# Patient Record
Sex: Male | Born: 1980 | Race: White | Hispanic: No | Marital: Single | Smoking: Former smoker
Health system: Southern US, Community
[De-identification: ages and names within clinical notes are randomized; demographics above are authoritative.]

## PROBLEM LIST (undated history)

## (undated) DIAGNOSIS — M25519 Pain in unspecified shoulder: Secondary | ICD-10-CM

## (undated) DIAGNOSIS — M549 Dorsalgia, unspecified: Secondary | ICD-10-CM

## (undated) DIAGNOSIS — G4730 Sleep apnea, unspecified: Secondary | ICD-10-CM

## (undated) HISTORY — DX: Sleep apnea, unspecified: G47.30

## (undated) HISTORY — DX: Pain in unspecified shoulder: M25.519

## (undated) HISTORY — DX: Dorsalgia, unspecified: M54.9

## (undated) HISTORY — PX: NO PAST SURGERIES: SHX2092

---

## 2011-11-27 ENCOUNTER — Ambulatory Visit (INDEPENDENT_AMBULATORY_CARE_PROVIDER_SITE_OTHER): Payer: BC Managed Care – PPO | Admitting: Family Medicine

## 2011-11-27 VITALS — BP 146/86 | HR 64 | Temp 98.7°F | Resp 16 | Ht 69.0 in | Wt 233.6 lb

## 2011-11-27 DIAGNOSIS — M549 Dorsalgia, unspecified: Secondary | ICD-10-CM

## 2011-11-27 MED ORDER — HYDROCODONE-ACETAMINOPHEN 5-500 MG PO TABS: 1.0000 | ORAL_TABLET | Freq: Every day | ORAL | Status: AC | PRN

## 2011-11-27 NOTE — Progress Notes (Signed)
  Subjective:    Patient ID: Kevin Velazquez, male    DOB: September 24, 1980, 31 y.o.   MRN: [id number]  HPI 31 yo male with complaints of back/neck/shoulder pain.  Started over a year ago.  Sometimes feels "cracking" in side of neck when he rubs it.  Pain often in left shoulder blade.  Occasional aware of "different feeling" in arm and hands.  Subjectively feels weak but he has no trouble lifting, carrying, or doing fine motor tasks.   Seen for same 05/08/11.  Treated with flexeril and naproxen.  Advised to use heat and massage.  Also advised if not better in 10 days, to call or return as we would consider MRI.  States meds did not help at all.  Nothing has changed.    Worried he has nerve damage or muscle damage.  Something that can't be fixed.    Review of Systems Negative except as per HPI     Objective:   Physical Exam  Constitutional: He appears well-developed and well-nourished.  Cardiovascular: Normal rate, regular rhythm, normal heart sounds and intact distal pulses.   No murmur heard. Pulmonary/Chest: Effort normal and breath sounds normal.  Neurological: He is alert. He has normal strength. He displays no atrophy and no tremor. He exhibits normal muscle tone. Gait normal.  Skin: Skin is warm and dry.          Assessment & Plan:  Pain, paresthesias - discussed could do MRI neck but patient worried about cost.  Feel PT more likely to be useful/helpful.  ARrange for PT referral.  In meantime, given #10 Vicodin 5 for only bad days.  No refill for at least a month.   >30 minutes spent in consultation and discussion

## 2013-07-28 ENCOUNTER — Ambulatory Visit (INDEPENDENT_AMBULATORY_CARE_PROVIDER_SITE_OTHER): Payer: BC Managed Care – PPO | Admitting: Family Medicine

## 2013-07-28 VITALS — BP 144/86 | HR 77 | Temp 97.9°F | Resp 16 | Ht 70.0 in | Wt 234.6 lb

## 2013-07-28 DIAGNOSIS — N50811 Right testicular pain: Secondary | ICD-10-CM

## 2013-07-28 DIAGNOSIS — N451 Epididymitis: Secondary | ICD-10-CM

## 2013-07-28 DIAGNOSIS — M549 Dorsalgia, unspecified: Secondary | ICD-10-CM

## 2013-07-28 LAB — POCT URINALYSIS DIPSTICK
Bilirubin, UA: NEGATIVE
Glucose, UA: NEGATIVE
Leukocytes, UA: NEGATIVE
Nitrite, UA: NEGATIVE

## 2013-07-28 MED ORDER — CIPROFLOXACIN HCL 500 MG PO TABS: 500.0000 mg | ORAL_TABLET | Freq: Two times a day (BID) | ORAL | Status: DC

## 2013-07-28 NOTE — Patient Instructions (Signed)
Start antibiotic for epididymitis infection. Return to the clinic or go to the nearest emergency room if any of your symptoms worsen or new symptoms occur, or if not improving in next week.  If still some soreness in area after antibiotic - return for recheck.  Epididymitis Epididymitis is a swelling (inflammation) of the epididymis. The epididymis is a cord-like structure along the back part of the testicle. Epididymitis is usually, but not always, caused by infection. This is usually a sudden problem beginning with chills, fever and pain behind the scrotum and in the testicle. There may be swelling and redness of the testicle. DIAGNOSIS  Physical examination will reveal a tender, swollen epididymis. Sometimes, cultures are obtained from the urine or from prostate secretions to help find out if there is an infection or if the cause is a different problem. Sometimes, blood work is performed to see if your white blood cell count is elevated and if a germ (bacterial) or viral infection is present. Using this knowledge, an appropriate medicine which kills germs (antibiotic) can be chosen by your caregiver. A viral infection causing epididymitis will most often go away (resolve) without treatment. HOME CARE INSTRUCTIONS   Hot sitz baths for 20 minutes, 4 times per day, may help relieve pain.  Only take over-the-counter or prescription medicines for pain, discomfort or fever as directed by your caregiver.  Take all medicines, including antibiotics, as directed. Take the antibiotics for the full prescribed length of time even if you are feeling better.  It is very important to keep all follow-up appointments. SEEK IMMEDIATE MEDICAL CARE IF:   You have a fever.  You have pain not relieved with medicines.  You have any worsening of your problems.  Your pain seems to come and go.  You develop pain, redness, and swelling in the scrotum and surrounding areas. MAKE SURE YOU:   Understand these  instructions.  Will watch your condition.  Will get help right away if you are not doing well or get worse. Document Released: 08/01/2000 Document Revised: 10/27/2011 Document Reviewed: 06/21/2009 [facility] Patient Information 2014 [place], [place].

## 2013-07-28 NOTE — Progress Notes (Signed)
Subjective:    Patient ID: Kevin Velazquez, male    DOB: 11/14/1980, 32 y.o.   MRN: [id number] This chart was scribed for Meredith Staggers, MD by Danella Maiers, ED Scribe. This patient was seen in room 1 and the patient's care was started at 8:54 PM.  Chief Complaint  Patient presents with  . Testicle Pain    x 1 week     HPI HPI Comments: Kevin Velazquez is a 32 y.o. male who presents to the [facility] complaining of constant, unchanged right testicular pain that he woke up with 5 days ago. He states the pain is worsened by sitting, better with standing. He states it feels like he was kicked although he was not. He reports associated right leg pain inside of the leg and groin pain. He has not tried any OTC medications. He states 2 weeks before the onset of symptoms he flicked the right testicule, had soreness for thirty minutes, but no soreness or swelling until this recent episode. He denies h/o similar pain. He denies penile discharge, dysuria, frequency, hematuria, fevers, back pain.  He has not been sexually active in 2-3 years. He denies h/o STDs.   PCP - No primary provider on file.  There are no active problems to display for this patient.  No past medical history on file. No past surgical history on file. No Known Allergies Prior to Admission medications   Not on File   History  Substance Use Topics  . Smoking status: Former Smoker    Quit date: 11/27/2010  . Smokeless tobacco: Not on file  . Alcohol Use: No    Review of Systems  Genitourinary: Positive for testicular pain. Negative for dysuria, frequency, hematuria, discharge, penile swelling, scrotal swelling, difficulty urinating and penile pain.       Objective:   Physical Exam  Nursing note and vitals reviewed. Constitutional: He is oriented to person, place, and time. He appears well-developed and well-nourished. No distress.  HENT:  Head: Normocephalic and atraumatic.  Eyes: EOM are normal.    Neck: Neck supple. No tracheal deviation present.  Cardiovascular: Normal rate.   Pulmonary/Chest: Effort normal. No respiratory distress.  Abdominal: Hernia confirmed negative in the right inguinal area.  Genitourinary: Penis normal. Right testis shows tenderness (tender to palpation at right epididymis slightly tender to distal inguinal canal without palpable hernia. No medial thigh or groin erythema noted. No wounds.). Right testis shows no mass and no swelling. No penile erythema or penile tenderness. No discharge found.  Musculoskeletal: Normal range of motion.  Lymphadenopathy:       Right: No inguinal adenopathy present.  Neurological: He is alert and oriented to person, place, and time.  Skin: Skin is warm and dry. No rash noted.  Psychiatric: He has a normal mood and affect. His behavior is normal.     Filed Vitals:   07/28/13 2039  BP: 144/86  Pulse: 77  Temp: 97.9 F (36.6 C)  TempSrc: Oral  Resp: 16  Height: 5\' 10"  (1.778 m)  Weight: 234 lb 9.6 oz (106.414 kg)  SpO2: 98%   Results for orders placed in visit on 07/28/13  POCT URINALYSIS DIPSTICK      Result Value Range   Color, UA yellow     Clarity, UA clear     Glucose, UA neg     Bilirubin, UA neg     Ketones, UA neg     Spec Grav, UA 1.025  Blood, UA neg     pH, UA 6.0     Protein, UA neg     Urobilinogen, UA 0.2     Nitrite, UA neg     Leukocytes, UA Negative         Assessment & Plan:   Kevin Velazquez is a 32 y.o. male Right testicular pain - Plan: POCT urinalysis dipstick, GC/chlamydia probe amp, genital, ciprofloxacin (CIPRO) 500 MG tablet  Epididymitis, right - Plan: POCT urinalysis dipstick, GC/chlamydia probe amp, genital, ciprofloxacin (CIPRO) 500 MG tablet  Early R sided epididymitis. No hernia palpated, not sexually active in past 2 years, doubt STI cause, but will check genprobe.  Start cipro - 500mg  BID, 10 days.  sx care as below.  RTC precautions discussed.   Meds ordered this  encounter  Medications  . ciprofloxacin (CIPRO) 500 MG tablet    Sig: Take 1 tablet (500 mg total) by mouth 2 (two) times daily.    Dispense:  20 tablet    Refill:  0   Patient Instructions  Start antibiotic for epididymitis infection. Return to the clinic or go to the nearest emergency room if any of your symptoms worsen or new symptoms occur, or if not improving in next week.  If still some soreness in area after antibiotic - return for recheck.  Epididymitis Epididymitis is a swelling (inflammation) of the epididymis. The epididymis is a cord-like structure along the back part of the testicle. Epididymitis is usually, but not always, caused by infection. This is usually a sudden problem beginning with chills, fever and pain behind the scrotum and in the testicle. There may be swelling and redness of the testicle. DIAGNOSIS  Physical examination will reveal a tender, swollen epididymis. Sometimes, cultures are obtained from the urine or from prostate secretions to help find out if there is an infection or if the cause is a different problem. Sometimes, blood work is performed to see if your white blood cell count is elevated and if a germ (bacterial) or viral infection is present. Using this knowledge, an appropriate medicine which kills germs (antibiotic) can be chosen by your caregiver. A viral infection causing epididymitis will most often go away (resolve) without treatment. HOME CARE INSTRUCTIONS   Hot sitz baths for 20 minutes, 4 times per day, may help relieve pain.  Only take over-the-counter or prescription medicines for pain, discomfort or fever as directed by your caregiver.  Take all medicines, including antibiotics, as directed. Take the antibiotics for the full prescribed length of time even if you are feeling better.  It is very important to keep all follow-up appointments. SEEK IMMEDIATE MEDICAL CARE IF:   You have a fever.  You have pain not relieved with  medicines.  You have any worsening of your problems.  Your pain seems to come and go.  You develop pain, redness, and swelling in the scrotum and surrounding areas. MAKE SURE YOU:   Understand these instructions.  Will watch your condition.  Will get help right away if you are not doing well or get worse. Document Released: 08/01/2000 Document Revised: 10/27/2011 Document Reviewed: 06/21/2009 [facility] Patient Information 2014 [place], [place].      I personally performed the services described in this documentation, which was scribed in my presence. The recorded information has been reviewed and considered, and addended by me as needed.

## 2013-07-30 LAB — GC/CHLAMYDIA PROBE AMP
CT Probe RNA: NEGATIVE
GC Probe RNA: NEGATIVE

## 2013-08-08 ENCOUNTER — Telehealth: Payer: Self-pay

## 2013-08-08 NOTE — Telephone Encounter (Signed)
Patient called, stated there is no improvement from visit.  He will return to clinic today for recheck.

## 2013-08-17 ENCOUNTER — Ambulatory Visit (INDEPENDENT_AMBULATORY_CARE_PROVIDER_SITE_OTHER): Payer: BC Managed Care – PPO | Admitting: Family Medicine

## 2013-08-17 VITALS — BP 136/78 | HR 58 | Temp 98.8°F | Resp 16 | Ht 69.5 in | Wt 230.2 lb

## 2013-08-17 DIAGNOSIS — N50819 Testicular pain, unspecified: Secondary | ICD-10-CM

## 2013-08-17 DIAGNOSIS — K6289 Other specified diseases of anus and rectum: Secondary | ICD-10-CM

## 2013-08-17 DIAGNOSIS — N509 Disorder of male genital organs, unspecified: Secondary | ICD-10-CM

## 2013-08-17 MED ORDER — DOXYCYCLINE HYCLATE 100 MG PO CAPS: 100.0000 mg | ORAL_CAPSULE | Freq: Two times a day (BID) | ORAL | Status: DC

## 2013-08-17 MED ORDER — HYDROCORTISONE 2.5 % RE CREA: 1.0000 "application " | TOPICAL_CREAM | Freq: Two times a day (BID) | RECTAL | Status: DC

## 2013-08-17 NOTE — Progress Notes (Signed)
Subjective: Patient is still having problems. Please refer back to Dr. Earlean Polka. He did little bit better he thought for a few days after the Cipro but then resumed hurting. He hurts in both testicles, what sometimes one-sided count, sometimes the other. He doesn't feel any abnormality in the scrotum. He also has some problems with redness and irritation around his but he gets a burning discomfort there.  Objective: Pleasant gentleman in no major distress. Normal male external genitalia. No testicular masses were detected. No hernias. He is just a little bit tender along the epididymis, but really insignificant. His anus appears a little bit ran out a couple skinfolds. He has old hemorrhoidal tag. Digital exam was unremarkable.  Assessment: Testicular and scrotal pain, persistent Anal irritation  Plan: Analpram-HC cream 2.5% Doxycycline 100 mg twice a day Referral to a urologist

## 2013-08-17 NOTE — Patient Instructions (Signed)
Use the rectal cream twice daily  Doxycycline one twice daily  We will set up a referral to a urologist for you

## 2013-08-17 NOTE — Progress Notes (Signed)
   Subjective:    Patient ID: Kevin Velazquez, male    DOB: [birth year], 32 y.o.   MRN: [id number] close HPI    Review of Systems     Objective:   Physical Exam        Assessment & Plan:  close

## 2013-09-07 ENCOUNTER — Emergency Department (HOSPITAL_COMMUNITY): Payer: BC Managed Care – PPO

## 2013-09-07 ENCOUNTER — Emergency Department (HOSPITAL_COMMUNITY)
Admission: EM | Admit: 2013-09-07 | Discharge: 2013-09-07 | Disposition: A | Discharge status: Home / Self Care | Payer: BC Managed Care – PPO | Attending: Emergency Medicine | Admitting: Emergency Medicine

## 2013-09-07 ENCOUNTER — Encounter (HOSPITAL_COMMUNITY): Payer: Self-pay | Admitting: Emergency Medicine

## 2013-09-07 DIAGNOSIS — N50819 Testicular pain, unspecified: Secondary | ICD-10-CM

## 2013-09-07 DIAGNOSIS — F1210 Cannabis abuse, uncomplicated: Secondary | ICD-10-CM | POA: Insufficient documentation

## 2013-09-07 DIAGNOSIS — R0602 Shortness of breath: Secondary | ICD-10-CM

## 2013-09-07 DIAGNOSIS — F1290 Cannabis use, unspecified, uncomplicated: Secondary | ICD-10-CM

## 2013-09-07 DIAGNOSIS — R109 Unspecified abdominal pain: Secondary | ICD-10-CM | POA: Insufficient documentation

## 2013-09-07 DIAGNOSIS — F17200 Nicotine dependence, unspecified, uncomplicated: Secondary | ICD-10-CM | POA: Insufficient documentation

## 2013-09-07 DIAGNOSIS — R079 Chest pain, unspecified: Secondary | ICD-10-CM | POA: Insufficient documentation

## 2013-09-07 DIAGNOSIS — N509 Disorder of male genital organs, unspecified: Secondary | ICD-10-CM | POA: Insufficient documentation

## 2013-09-07 MED ORDER — TRAMADOL HCL 50 MG PO TABS: 50.0000 mg | ORAL_TABLET | Freq: Four times a day (QID) | ORAL | Status: DC | PRN

## 2013-09-07 NOTE — Progress Notes (Signed)
   CARE MANAGEMENT ED NOTE 09/07/2013  Patient:  Kevin Velazquez,Kevin Velazquez   Account Number:  [id number]  Date Initiated:  09/07/2013  Documentation initiated by:  Radford PaxFERRERO,Dicky Boer  Subjective/Objective Assessment:   Patient presents to Ed with difficulty breathing     Subjective/Objective Assessment Detail:   Chest xray completed and negative.     Action/Plan:   Action/Plan Detail:   Anticipated DC Date:       Status Recommendation to Physician:   Result of Recommendation:    Other ED Services  Consult Working Plan    DC Planning Services  Other  PCP issues    Choice offered to / List presented to:            Status of service:  Completed, signed off  ED Comments:   ED Comments Detail:  Patient confirms he does  not have a pcp but does have [employer] insurance.  EDCM instructed patient to call the phone number on the back of his insurance card or go to insurance company website to help him find a pcp who is close to him and within network.  Patient verbalized understanding.

## 2013-09-07 NOTE — ED Notes (Signed)
Pt also reports that he is having genital pain and has a follow up appointment with a urologist

## 2013-09-07 NOTE — ED Notes (Signed)
Pt complain of having difficulty breathing; pt states that he feels like he cannot get a good deep breathe; pt c/o bilateral rib pain; pt denies congestion or hx of asthma; pt states that he was a smoker and smoke occasionally and also had smoked marijuania this evening and difficulty breathing has gotten worse after smoking; pt states that he feels anxious due to the difficulty breathing

## 2013-09-07 NOTE — ED Provider Notes (Signed)
CSN: [id number]     Arrival date & time 09/07/13  2035 History   First MD Initiated Contact with Patient 09/07/13 2139     Chief Complaint  Patient presents with  . Respiratory Distress   (Consider location/radiation/quality/duration/timing/severity/associated sxs/prior Treatment) HPI Pt is a 33yo male c/o dyspnea earlier today, reports it felt like it panic attack. States symptoms started after he noticed an "odd sensation" in his stomach after he masturbated. States it felt like his stomach went numb. Pt also c/o centralized and left sided chest pain that is aching and sore but states that has been intermittent for 3 years and is not new for him. Reports having left sided "nerve problems" due to a "knot" in his back where he "carries all his stress." Admits to not sleeping well last night and smoking marijuanna earlier today. SOB started after smoking, but pt states he has smoked for 12 years and has never felt SOB. Does report hx of 1 other panic attack.  Pt also states he has had intermittent testicular discomfort for 2 months and has scheduled f/u with urology for Feb 6th. States this has been on his mind a lot lately and believes it also contributed to his anxiety and SOB earlier today. Denies recent illness. Denies fever. Denies n/v/d. Denies hx of asthma.  History reviewed. No pertinent past medical history. No past surgical history on file. Family History  Problem Relation Age of Onset  . Cancer Mother   . Stroke Maternal Grandmother   . Mental illness Maternal Grandmother   . Cancer Maternal Grandfather    History  Substance Use Topics  . Smoking status: Current Some Day Smoker    Types: Cigarettes    Last Attempt to Quit: 11/27/2010  . Smokeless tobacco: Not on file  . Alcohol Use: No    Review of Systems  Constitutional: Negative for fever and chills.  Respiratory: Positive for shortness of breath.   Cardiovascular: Positive for chest pain.  Gastrointestinal: Positive for  abdominal pain ( "numbness"). Negative for nausea and vomiting.  All other systems reviewed and are negative.    Allergies  Review of patient's allergies indicates no known allergies.  Home Medications   Current Outpatient Rx  Name  Route  Sig  Dispense  Refill  . ibuprofen (ADVIL,MOTRIN) 200 MG tablet   Oral   Take 600 mg by mouth every 6 (six) hours as needed for moderate pain.          . traMADol (ULTRAM) 50 MG tablet   Oral   Take 1 tablet (50 mg total) by mouth every 6 (six) hours as needed.   15 tablet   0    BP 159/85  Pulse 110  Temp(Src) 98.7 F (37.1 C) (Oral)  Resp 20  Ht 5\' 9"  (1.753 m)  Wt 223 lb (101.152 kg)  BMI 32.92 kg/m2  SpO2 97% Physical Exam  Nursing note and vitals reviewed. Constitutional: He appears well-developed and well-nourished.  Pt lying comfortably on exam bed, NAD. Watching television.  HENT:  Head: Normocephalic and atraumatic.  Eyes: Conjunctivae are normal. No scleral icterus.  Neck: Normal range of motion.  Cardiovascular: Normal rate, regular rhythm and normal heart sounds.   Pulmonary/Chest: Effort normal and breath sounds normal. No respiratory distress. He has no wheezes. He has no rales. He exhibits no tenderness.  No respiratory distress, able to speak in full sentences w/o difficulty. Lungs: CTAB  Abdominal: Soft. Bowel sounds are normal. He exhibits no distension and  no mass. There is no tenderness. There is no rebound and no guarding.  Musculoskeletal: Normal range of motion.  Neurological: He is alert.  Skin: Skin is warm and dry.    ED Course  Procedures (including critical care time) Labs Review Labs Reviewed - No data to display Imaging Review Dg Chest 2 View  09/07/2013   CLINICAL DATA:  Shortness of breath.  EXAM: CHEST  2 VIEW  COMPARISON:  None.  FINDINGS: The lungs are well-aerated and clear. There is no evidence of focal opacification, pleural effusion or pneumothorax.  The heart is normal in size; the  mediastinal contour is within normal limits. No acute osseous abnormalities are seen.  IMPRESSION: No acute cardiopulmonary process seen.   Electronically Signed   By: Roanna Raider M.D.   On: 09/07/2013 22:26    EKG Interpretation   None       MDM   1. SOB (shortness of breath)   2. Marijuana use   3. Abdominal pain   4. Chest pain   5. Testicular discomfort    Pt c/o SOB after smoking marijuana earlier today. Reports thinking it was due to anxiety like a panic attack as pt has had a panic attack in past. Admits to lack of sleep and feeling anxious about "odd" sensation in his stomach along with testicular discomfort. Reports chest pain but states it has been present for 3 years.  States symptoms have resolved since being in ED. Vitals: unremarkable.  CXR: unremarkable.    Not concerned for ACS or PE.  Do not believe further workup needed at this time.   CXR discussed with patient. All questions answered and concerns addressed. Will discharge pt home and have pt f/u with [facility] info provided. Also advised to f/u as scheduled with urology Feb 6th. Return precautions given. Pt verbalized understanding and agreement with tx plan. Vitals: unremarkable. Discharged in stable condition.         Junius Finner, PA-C 09/08/13 0110

## 2013-09-08 NOTE — ED Provider Notes (Signed)
Medical screening examination/treatment/procedure(s) were performed by non-physician practitioner and as supervising physician I was immediately available for consultation/collaboration.  EKG Interpretation   None         Gwyneth SproutWhitney Jerald Hennington, MD 09/08/13 [phone]

## 2014-05-30 ENCOUNTER — Other Ambulatory Visit: Payer: Self-pay | Admitting: Family Medicine

## 2014-05-30 ENCOUNTER — Ambulatory Visit
Admission: RE | Admit: 2014-05-30 | Discharge: 2014-05-30 | Disposition: A | Discharge status: Home / Self Care | Payer: BC Managed Care – PPO | Source: Ambulatory Visit | Attending: Family Medicine | Admitting: Family Medicine

## 2014-05-30 DIAGNOSIS — R0789 Other chest pain: Secondary | ICD-10-CM

## 2014-05-30 DIAGNOSIS — R0602 Shortness of breath: Secondary | ICD-10-CM

## 2014-05-31 ENCOUNTER — Encounter: Payer: Self-pay | Admitting: Internal Medicine

## 2014-05-31 ENCOUNTER — Ambulatory Visit (INDEPENDENT_AMBULATORY_CARE_PROVIDER_SITE_OTHER): Payer: BC Managed Care – PPO | Admitting: Internal Medicine

## 2014-05-31 VITALS — BP 124/80 | HR 60 | Temp 98.8°F | Ht 69.0 in | Wt 230.0 lb

## 2014-05-31 DIAGNOSIS — R0600 Dyspnea, unspecified: Secondary | ICD-10-CM | POA: Insufficient documentation

## 2014-05-31 NOTE — Patient Instructions (Signed)
GERD (REFLUX)  is an extremely common cause of respiratory symptoms, many times with no significant heartburn at all.    It can be treated with medication, but also with lifestyle changes including avoidance of late meals, excessive alcohol, smoking cessation, and avoid fatty foods, chocolate, peppermint, colas, red wine, and acidic juices such as orange juice.  NO MINT OR MENTHOL PRODUCTS SO NO COUGH DROPS  USE SUGARLESS CANDY INSTEAD (jolley ranchers or Stover's)  NO OIL BASED VITAMINS - use powdered substitutes.  Zostrix apply 4 x daily along the distribution of the pain and taper to bedtime dose once improves   Try prilosec 20mg   Take 30-60 min before first meal of the day and Pepcid 20 mg one bedtime    Please schedule a follow up office visit in 2 weeks, sooner if needed

## 2014-05-31 NOTE — Progress Notes (Signed)
Subjective:    Patient ID: Kevin Velazquez, male    DOB: 01/31/1981    MRN: [id number]  HPI  33 yowm quit smoking  11/2008 with sob that really never resolved and did not directly related to intensity of exercise so referred to pulmonary clinic 05/31/2014 by Ronny Flurryourtney Whaton    05/31/2014 1st [facility] office visit/ [facility]   Chief Complaint  Patient presents with  . Pulmonary Consult    Referred by Dr. Sigmund HazelLisa Miller. Pt c/o SOB on and off since beginning of 2015.  He states that breathing has been esp worse wigth chest tightness for the past 2 wks. He states he is SOB with or without any exertion.   sob x 5 years variable intensity can last as short as 2 min if comes on at rest and not necessarily provoked with activity (has not done aerobics since onset) and now consistenly present x 2 weeks except when sleeping  But avg once a month wake up gasping for a breath  and can last up an hour x one year.  Sometimes gets hot with it and light headed , never really coughing, some sweats but no nausea, with sensation of tightness in chest "like can't take a deep breath in" but no ex cp or pleuritic features.   No obvious other patterns in day to day or daytime variabilty or assoc chronic cough or  subjective wheeze overt sinus or hb symptoms. No unusual exp hx or h/o childhood pna/ asthma or knowledge of premature birth.  Most nights Sleeping ok without nocturnal  or early am exacerbation  of respiratory  c/o's or need for noct saba. Also denies any obvious fluctuation of symptoms with weather or environmental changes or other aggravating or alleviating factors except as outlined above   Current Medications, Allergies, Complete Past Medical History, Past Surgical History, Family History, and Social History were reviewed in [facility] electronic medical record.            Review of Systems  Constitutional: Negative for fever, chills, activity change, appetite change and unexpected weight  change.  HENT: Positive for dental problem. Negative for congestion, postnasal drip, rhinorrhea, sneezing, sore throat, trouble swallowing and voice change.   Eyes: Negative for visual disturbance.  Respiratory: Positive for shortness of breath. Negative for cough and choking.   Cardiovascular: Positive for chest pain. Negative for leg swelling.  Gastrointestinal: Negative for nausea, vomiting and abdominal pain.  Genitourinary: Negative for difficulty urinating.  Musculoskeletal: Negative for arthralgias.  Skin: Negative for rash.  Psychiatric/Behavioral: Negative for behavioral problems and confusion.       Objective:   Physical Exam  amb wm nad  Wt Readings from Last 3 Encounters:  05/31/14 230 lb (104.327 kg)  09/07/13 223 lb (101.152 kg)  08/17/13 230 lb 3.2 oz (104.418 kg)     HEENT: nl dentition, turbinates, and orophanx. Nl external ear canals without cough reflex   NECK :  without JVD/Nodes/TM/ nl carotid upstrokes bilaterally   LUNGS: no acc muscle use, clear to A and P bilaterally without cough on insp or exp maneuvers   CV:  RRR  no s3 or murmur or increase in P2, no edema   ABD:  soft and nontender with nl excursion in the supine position. No bruits or organomegaly, bowel sounds nl  MS:  warm without deformities, calf tenderness, cyanosis or clubbing  SKIN: warm and dry without lesions    NEURO:  alert, approp, no deficits  cxr 05/30/14 The heart size and mediastinal contours are within normal limits.  There is no focal infiltrate, pulmonary edema, or pleural effusion.  The visualized skeletal structures are unremarkable                Assessment & Plan:

## 2014-06-01 NOTE — Assessment & Plan Note (Signed)
-   spirometry 05/31/2014 on effort indep portion but non -physiologic effort dep portion - 05/31/2014  Walked RA @ nl pace x 3 laps @ 185 ft each stopped due to  , light headed, no desat   Symptoms are markedly disproportionate to objective findings and not clear this is a lung problem but pt does appear to have difficult airway management issues. DDX of  difficult airways management all start with A and  include Adherence, Ace Inhibitors, Acid Reflux, Active Sinus Disease, Alpha 1 Antitripsin deficiency, Anxiety masquerading as Airways dz,  ABPA,  allergy(esp in young), Aspiration (esp in elderly), Adverse effects of DPI,  Active smokers, plus two Bs  = Bronchiectasis and Beta blocker use..and one C= CHF  ? Anxiety high on list as unable to reproduce with ex  ? Acid (or non-acid) GERD > always difficult to exclude as up to 75% of pts in some series report no assoc GI/ Heartburn symptoms> rec max (24h)  acid suppression and diet restrictions/ reviewed and instructions given in writing.

## 2014-06-14 ENCOUNTER — Ambulatory Visit (INDEPENDENT_AMBULATORY_CARE_PROVIDER_SITE_OTHER): Payer: BC Managed Care – PPO | Admitting: Internal Medicine

## 2014-06-14 ENCOUNTER — Encounter: Payer: Self-pay | Admitting: Internal Medicine

## 2014-06-14 VITALS — BP 146/80 | HR 80 | Ht 69.0 in | Wt 235.6 lb

## 2014-06-14 DIAGNOSIS — R0600 Dyspnea, unspecified: Secondary | ICD-10-CM

## 2014-06-14 NOTE — Progress Notes (Signed)
Subjective:    Patient ID: Kevin Velazquez, male    DOB: 08/20/1980    MRN: [id number]    .Brief patient profile:  33 yowm quit smoking  11/2008 with sob that really never resolved and did not directly related to intensity of exercise so referred to pulmonary clinic 05/31/2014 by Ronny Flurryourtney Whaton    History of Present Illness  05/31/2014 1st [facility] office visit/ Kevin Velazquez   Chief Complaint  Patient presents with  . Pulmonary Consult    Referred by Dr. Sigmund HazelLisa Miller. Pt c/o SOB on and off since beginning of 2015.  He states that breathing has been esp worse wigth chest tightness for the past 2 wks. He states he is SOB with or without any exertion.   sob x 5 years variable intensity can last as short as 2 min if comes on at rest and not necessarily provoked with activity (has not done aerobics since onset) and now consistenly present x 2 weeks except when sleeping  But avg once a month wake up gasping for a breath  and can last up an hour x one year.  Sometimes gets hot with it and light headed , never really coughing, some sweats but no nausea, with sensation of tightness in chest "like can't take a deep breath in" but no ex cp or pleuritic features.  rec GERD  Try prilosec 20mg   Take 30-60 min before first meal of the day and Pepcid 20 mg one bedtime     06/14/2014 f/u ov/Anabia Weatherwax re: unexplained sob/ did not take pepcid at hs  Chief Complaint  Patient presents with  . Follow-up    patient feels that sob is the same. He does have chest tightness with occasional cough.  thinks cats in his appt are bothering him at hs, not using pepcid as rec and chewing mint gum Not doing any kind of regular exercise with new doe x [place] x summer 2015 but note c/o doe dates back to 2010 when quit smoking.   ? How do you sleep when not around cats A  I don't know, never tried ?  So you've always slept with cats A No    No obvious  day to day or daytime variabilty or excess mucus production or cp    subjective wheeze overt sinus or hb symptoms. No unusual exp hx or h/o childhood pna/ asthma or knowledge of premature birth.  Sleeping ok without nocturnal  or early am exacerbation  of respiratory  c/o's or need for noct saba. Also denies any obvious fluctuation of symptoms with weather or environmental changes or other aggravating or alleviating factors except as outlined above   Current Medications, Allergies, Complete Past Medical History, Past Surgical History, Family History, and Social History were reviewed in [facility] electronic medical record.  ROS  The following are not active complaints unless bolded sore throat, dysphagia, dental problems, itching, sneezing,  nasal congestion or excess/ purulent secretions, ear ache,   fever, chills, sweats, unintended wt loss, pleuritic or exertional cp, hemoptysis,  orthopnea pnd or leg swelling, presyncope, palpitations, heartburn, abdominal pain, anorexia, nausea, vomiting, diarrhea  or change in bowel or urinary habits, change in stools or urine, dysuria,hematuria,  rash, arthralgias, visual complaints, headache, numbness weakness or ataxia or problems with walking or coordination,  change in mood/affect or memory.  Objective:   Physical Exam  amb wm nad  06/14/2014      235 Wt Readings from Last 3 Encounters:  05/31/14 230 lb (104.327 kg)  09/07/13 223 lb (101.152 kg)  08/17/13 230 lb 3.2 oz (104.418 kg)     HEENT: nl dentition, turbinates, and orophanx. Nl external ear canals without cough reflex   NECK :  without JVD/Nodes/TM/ nl carotid upstrokes bilaterally   LUNGS: no acc muscle use, clear to A and P bilaterally without cough on insp or exp maneuvers   CV:  RRR  no s3 or murmur or increase in P2, no edema   ABD:  soft and nontender with nl excursion in the supine position. No bruits or organomegaly, bowel sounds nl  MS:  warm without deformities, calf tenderness, cyanosis or  clubbing  SKIN: warm and dry without lesions    NEURO:  alert, approp, no deficits     cxr 05/30/14 The heart size and mediastinal contours are within normal limits.  There is no focal infiltrate, pulmonary edema, or pleural effusion.  The visualized skeletal structures are unremarkable                Assessment & Plan:

## 2014-06-14 NOTE — Patient Instructions (Addendum)
To get the most out of exercise, you need to be continuously aware that you are short of breath, but never out of breath, for 30 minutes daily. As you improve, it will actually be easier for you to do the same amount of exercise  in  30 minutes so always push to the level where you are short of breath for at least two weeks and if not improving you need a cpst with spirometry before and after - call Almyra FreeLibby at [phone] to schedule    I strongly recommend you keep cats away from your bed and if you have questions about nocturnal  asthma next step methacholine challenge call Libby at [phone] to schedule - other option is to see an allergist of your choosing   Add pepcid 20 mg one at bedtime  GERD (REFLUX)  is an extremely common cause of respiratory symptoms just like yours, many times with no significant heartburn at all.    It can be treated with medication, but also with lifestyle changes including avoidance of late meals, excessive alcohol, smoking cessation, and avoid fatty foods, chocolate, peppermint, colas, red wine, and acidic juices such as orange juice.  NO MINT OR MENTHOL PRODUCTS SO NO COUGH DROPS  USE SUGARLESS CANDY INSTEAD (jolley ranchers or Stover's)  NO OIL BASED VITAMINS - use powdered substitutes.

## 2014-06-15 NOTE — Assessment & Plan Note (Addendum)
-   spirometry 05/31/2014 on effort indep portion but non -physiologic effort dep portion - 05/31/2014  Walked RA @ nl pace x 3 laps @ 185 ft each stopped due to  , light headed, no desat   So he has 2 different patterns of sob  1) at hs which could be asthma but stated even if it was he would not give up his cats > methacholine challenge testing best option or he should see allergist  2) reproducible doe which is likely deconditioning > rec CPST only after complying with gerd recs x 2 full weeks  See instructions for specific recommendations which were reviewed directly with the patient who was given a copy with highlighter outlining the key components.

## 2015-03-22 ENCOUNTER — Other Ambulatory Visit (HOSPITAL_COMMUNITY): Payer: Self-pay | Admitting: Otolaryngology

## 2015-03-22 DIAGNOSIS — R42 Dizziness and giddiness: Secondary | ICD-10-CM

## 2015-04-13 ENCOUNTER — Ambulatory Visit (HOSPITAL_COMMUNITY): Payer: BLUE CROSS/BLUE SHIELD

## 2018-08-16 DIAGNOSIS — Z13220 Encounter for screening for lipoid disorders: Secondary | ICD-10-CM | POA: Diagnosis not present

## 2018-08-16 DIAGNOSIS — Z833 Family history of diabetes mellitus: Secondary | ICD-10-CM | POA: Diagnosis not present

## 2018-08-16 DIAGNOSIS — R42 Dizziness and giddiness: Secondary | ICD-10-CM | POA: Diagnosis not present

## 2018-08-16 DIAGNOSIS — G4733 Obstructive sleep apnea (adult) (pediatric): Secondary | ICD-10-CM | POA: Diagnosis not present

## 2018-08-25 DIAGNOSIS — E781 Pure hyperglyceridemia: Secondary | ICD-10-CM | POA: Diagnosis not present

## 2018-08-25 DIAGNOSIS — E782 Mixed hyperlipidemia: Secondary | ICD-10-CM | POA: Diagnosis not present

## 2018-09-14 DIAGNOSIS — R42 Dizziness and giddiness: Secondary | ICD-10-CM | POA: Diagnosis not present

## 2018-09-14 DIAGNOSIS — R51 Headache: Secondary | ICD-10-CM | POA: Diagnosis not present

## 2018-09-15 ENCOUNTER — Other Ambulatory Visit: Payer: Self-pay | Admitting: Otolaryngology

## 2018-09-15 DIAGNOSIS — G4459 Other complicated headache syndrome: Secondary | ICD-10-CM

## 2018-09-15 DIAGNOSIS — R42 Dizziness and giddiness: Secondary | ICD-10-CM

## 2018-09-26 ENCOUNTER — Ambulatory Visit
Admission: RE | Admit: 2018-09-26 | Discharge: 2018-09-26 | Disposition: A | Discharge status: Home / Self Care | Payer: BLUE CROSS/BLUE SHIELD | Source: Ambulatory Visit | Attending: Otolaryngology | Admitting: Otolaryngology

## 2018-09-26 DIAGNOSIS — R42 Dizziness and giddiness: Secondary | ICD-10-CM | POA: Diagnosis not present

## 2018-09-26 DIAGNOSIS — R51 Headache: Secondary | ICD-10-CM | POA: Diagnosis not present

## 2018-09-26 DIAGNOSIS — G4459 Other complicated headache syndrome: Secondary | ICD-10-CM

## 2018-09-26 MED ORDER — GADOBENATE DIMEGLUMINE 529 MG/ML IV SOLN
20.0000 mL | Freq: Once | INTRAVENOUS | Status: AC | PRN
  Administered 2018-09-26: 20 mL via INTRAVENOUS

## 2018-09-30 ENCOUNTER — Ambulatory Visit: Payer: BLUE CROSS/BLUE SHIELD | Admitting: Physical Therapy

## 2018-10-06 ENCOUNTER — Ambulatory Visit: Payer: BLUE CROSS/BLUE SHIELD | Admitting: Neurology

## 2018-10-06 ENCOUNTER — Encounter: Payer: Self-pay | Admitting: Neurology

## 2018-10-06 VITALS — BP 123/77 | HR 56 | Ht 69.0 in | Wt 236.0 lb

## 2018-10-06 DIAGNOSIS — R42 Dizziness and giddiness: Secondary | ICD-10-CM | POA: Insufficient documentation

## 2018-10-06 DIAGNOSIS — G4730 Sleep apnea, unspecified: Secondary | ICD-10-CM

## 2018-10-06 DIAGNOSIS — G8929 Other chronic pain: Secondary | ICD-10-CM | POA: Insufficient documentation

## 2018-10-06 DIAGNOSIS — R51 Headache: Secondary | ICD-10-CM

## 2018-10-06 NOTE — Patient Instructions (Signed)

## 2018-10-06 NOTE — Progress Notes (Signed)
[facility]    Provider:  Dr Lucia Gaskins Referring Provider: [facility] Brynda Peon, MD Primary Care Provider:  [facility], Silverio Decamp, MD  CC: Headaches, random dizziness, ringing in right ear, right side of head hurts in the back of right eye hurts sometimes.  HPI:  Kevin Velazquez is a 38 y.o. male here as requested by provider [facility], [facility] for dizziness.  Past medical history diabetes, headache. Dr. Yehuda Mao in [place] did the sleep test at [facility] sleep. He has moderately severe sleep apnea and not using his cpap. He started having ringing in his ears 3-4 years ago, he saw an ENT 3 years ago and had headaches and dizziness 3 years ago as well. Last year he was having dizzy spells, headaches throughout the day always has a headache, discovered he has sleep apnea. He has not been using the cpap. His dizziness was very bad the beginning of last year when he discovered he had sleep apnea. Feeling light headed and "spinny" like drunk. No falls. He felt better last summer, around November and December the dizziness worsened. Movement made it worse or even just sitting there it would worsen. Headachs are constant and with the dizziness, feels like behind his right eye, pounding/pulsating/throbbing, also blood rushing in his head. On the right he has pain, he has sound sensitivity, no nausea. Unknown family history of migraines. No left-sided symptoms.   Reviewed notes, labs and imaging from outside physicians, which showed:  Cbc/cmp normal  Reviewed notes from Dr. Lucky Rathke office.  Patient presented with intermittent problems with dizziness and vertigo.  Sometimes he described a lightheaded sensation but often a true spinning sensation.  Most recently he is noticing spinning sensation especially with changes in position but not necessarily related to turning over to the side while lying supine.  He denies any change in his  hearing.  He was not able to afford an MRI several years ago when that was recommended.  He also has bad headaches at times but is but never been diagnosed with migraines.  Otherwise in pretty good health.  He was diagnosed with sleep apnea and is trying to get accustomed to CPAP but he is having significant problems with that.  Reviewed exam which was normal including neurologic exam.  Dix-Hallpike was negative.  He was referred to MRI.  Spontaneous nystagmus was negative.  Review of Systems: Patient complains of symptoms per HPI as well as the following symptoms: Fatigue, eye pain, easy bruising, snoring, ringing in ears, headache, dizziness, sleepiness, snoring, anxiety, not enough sleep, decreased energy, back pain, shoulder pain. Pertinent negatives and positives per HPI. All others negative.   Social History   Socioeconomic History  . Marital status: Single    Spouse name: Not on file  . Number of children: 0  . Years of education: Not on file  . Highest education level: High school graduate  Occupational History  . Occupation: Scientist, physiological: [employer]  Social Needs  . Financial resource strain: Not on file  . Food insecurity:    Worry: Not on file    Inability: Not on file  . Transportation needs:    Medical: Not on file    Non-medical: Not on file  Tobacco Use  . Smoking status: Former Smoker    Packs/day: 1.00    Years: 12.00    Pack years: 12.00    Types: Cigarettes    Last  attempt to quit: 11/16/2008    Years since quitting: 9.8  . Smokeless tobacco: Never Used  Substance and Sexual Activity  . Alcohol use: No  . Drug use: Yes    Types: Marijuana    Comment: "from time to time"  . Sexual activity: Not on file  Lifestyle  . Physical activity:    Days per week: Not on file    Minutes per session: Not on file  . Stress: Not on file  Relationships  . Social connections:    Talks on phone: Not on file    Gets together: Not on file    Attends  religious service: Not on file    Active member of club or organization: Not on file    Attends meetings of clubs or organizations: Not on file    Relationship status: Not on file  . Intimate partner violence:    Fear of current or ex partner: Not on file    Emotionally abused: Not on file    Physically abused: Not on file    Forced sexual activity: Not on file  Other Topics Concern  . Not on file  Social History Narrative   Lives at home alone    Right handed   Caffeine: coffee 14 oz every morning, sometimes a soda with lunch    Family History  Problem Relation Age of Onset  . Breast cancer Mother   . Atrial fibrillation Mother   . Atrial fibrillation Father   . Stroke Maternal Grandmother   . Heart attack Maternal Grandmother   . Emphysema Maternal Grandfather        smoked  . Cancer Maternal Grandfather        bone marrow   . Alzheimer's disease Paternal Grandmother   . Dementia Maternal Uncle   . Migraines Neg Hx        not that he recalls     Past Medical History:  Diagnosis Date  . Back pain   . Shoulder pain   . Sleep apnea     Patient Active Problem List   Diagnosis Date Noted  . Sleep apnea 10/06/2018  . Chronic intractable headache 10/06/2018  . Dizziness 10/06/2018  . Dyspnea 05/31/2014    Past Surgical History:  Procedure Laterality Date  . NO PAST SURGERIES      Current Outpatient Medications  Medication Sig Dispense Refill  . diphenhydrAMINE (BENADRYL) 25 mg capsule Take 25 mg by mouth every 8 (eight) hours as needed.    . Multiple Vitamins-Minerals (MENS MULTIVITAMIN PLUS PO) Take 1 tablet by mouth daily.     No current facility-administered medications for this visit.     Allergies as of 10/06/2018  . (No Known Allergies)    Vitals: BP 123/77 (BP Location: Right Arm, Patient Position: Sitting)   Pulse (!) 56   Ht 5\' 9"  (1.753 m)   Wt 236 lb (107 kg)   BMI 34.85 kg/m  Last Weight:  Wt Readings from Last 1 Encounters:  10/06/18  236 lb (107 kg)   Last Height:   Ht Readings from Last 1 Encounters:  10/06/18 5\' 9"  (1.753 m)     Physical exam: Exam: Gen: NAD, conversant, well nourised, obese, well groomed                     CV: RRR, no MRG. No Carotid Bruits. No peripheral edema, warm, nontender Eyes: Conjunctivae clear without exudates or hemorrhage  Neuro: Detailed Neurologic Exam  Speech:  Speech is normal; fluent and spontaneous with normal comprehension.  Cognition:    The patient is oriented to person, place, and time;     recent and remote memory intact;     language fluent;     normal attention, concentration,     fund of knowledge Cranial Nerves:    The pupils are equal, round, and reactive to light. The fundi are normal and spontaneous venous pulsations are present. Visual fields are full to finger confrontation. Extraocular movements are intact. Trigeminal sensation is intact and the muscles of mastication are normal. The face is symmetric. The palate elevates in the midline. Hearing intact. Voice is normal. Shoulder shrug is normal. The tongue has normal motion without fasciculations.   Coordination:    Normal finger to nose and heel to shin. Normal rapid alternating movements.   Gait:    Heel-toe and tandem gait are normal.   Motor Observation:    No asymmetry, no atrophy, and no involuntary movements noted. Tone:    Normal muscle tone.    Posture:    Posture is normal. normal erect    Strength:    Strength is V/V in the upper and lower limbs.      Sensation: intact to LT     Reflex Exam:  DTR's:    Deep tendon reflexes in the upper and lower extremities are normal bilaterally.   Toes:    The toes are downgoing bilaterally.   Clonus:    Clonus is absent.    Assessment/Plan:  38 year old with headaches and dizziness. Could be migraines however I think this is due to untreated sleep apnea (he says AHI 47 which is significant). We discussed OSA, sequelae such as stroke, CV  disease, headache, fatigue and others. He agrees to try to use his cpap.   Cc: [facility]  [facility]  Dr. Pollyann Kennedyosen and Dr. Tamera PuntMiller  Katlyne Nishida, MD  [facility] [address] [place], [place] [id number]  Phone [phone] Fax [phone]

## 2020-08-13 IMAGING — MR MR BRAIN/IAC WO/W
11 of 12 series · 45 of 48 positions shown · IV contrast (multihance)
Comparison: None.

CLINICAL DATA: 37-year-old male with vertigo, dizziness and
headaches for 4 months. Remote prior concussion at age 13.

EXAM:
MRI HEAD WITHOUT AND WITH CONTRAST
TECHNIQUE: Multiplanar, multiecho pulse sequences of the brain and surrounding
structures were obtained without and with intravenous contrast.
CONTRAST:  20mL MULTIHANCE GADOBENATE DIMEGLUMINE 529 MG/ML IV SOLN

[Series 5: T1 · sagittal · 4.0mm · 0.72mm/px · 3 of 29 slices shown (1 of 3)]
[im 1/29]
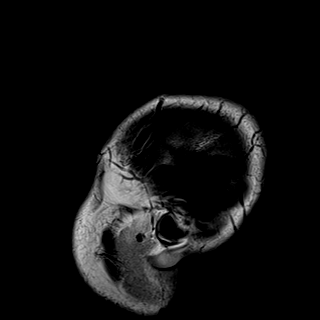
[im 15/29]
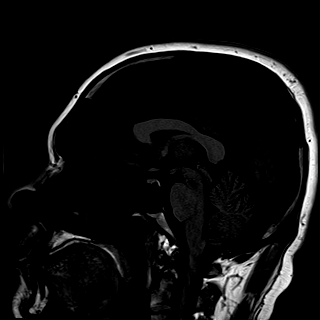
[im 29/29]
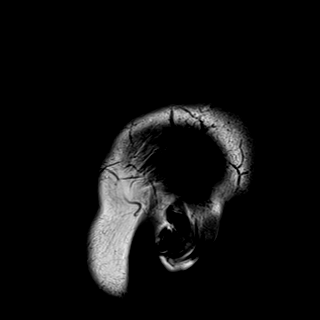

[Series 6: T2 · axial · 4.0mm · 0.36mm/px · z∈[-74,+65]mm · 3 of 28 slices shown]
[im 1/28]
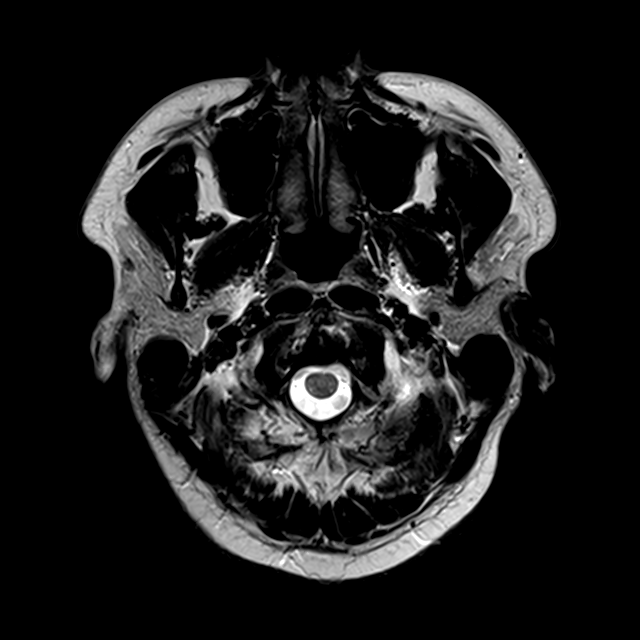
[im 14/28]
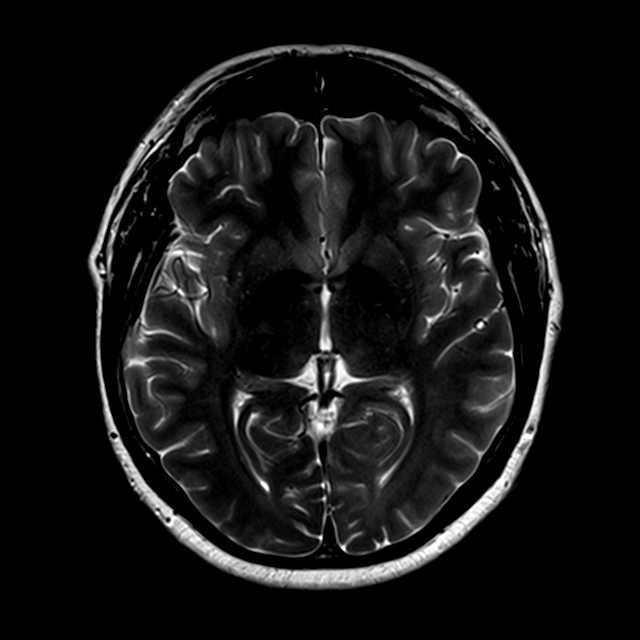
[im 28/28]
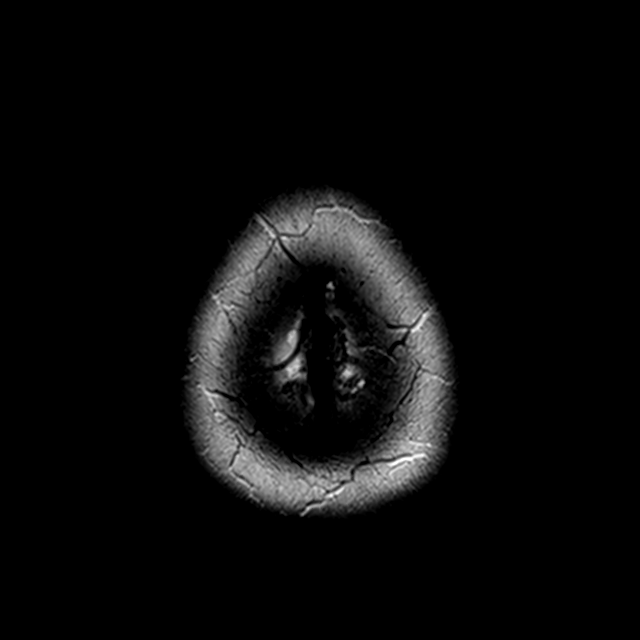

[Series 7: DWI · axial · 3.0mm · 1.44mm/px · z∈[-75,+66]mm · 8 of 88 slices shown (1 of 2)]
[im 1/88]
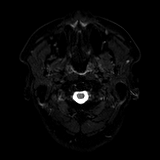
[im 13/88]
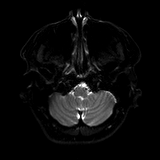
[im 25/88]
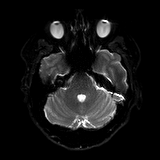
[im 38/88]
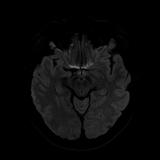
[im 50/88]
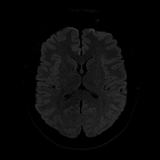
[im 63/88]
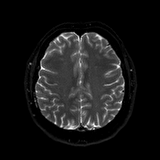
[im 75/88]
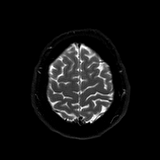
[im 88/88]
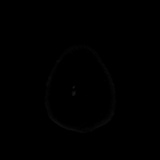

[Series 8: DWI · axial · 3.0mm · 1.44mm/px · z∈[-75,+66]mm · 4 of 44 slices shown (2 of 2)]
[im 1/44]
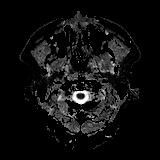
[im 15/44]
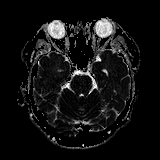
[im 29/44]
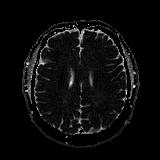
[im 44/44]
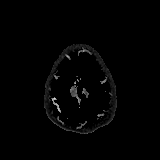

[Series 9: T1 · coronal · 2.5mm · 0.56mm/px · 1 of 13 slices shown (2 of 3)]
[im 1/13]
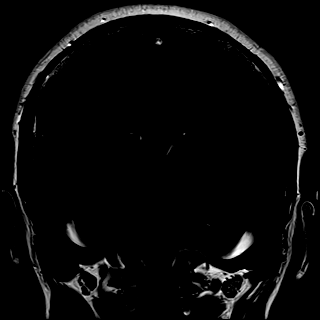

[Series 11: swi_images · axial · 3.0mm · 0.90mm/px · z∈[-74,+66]mm · 4 of 48 slices shown]
[im 1/48]
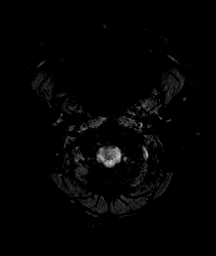
[im 16/48]
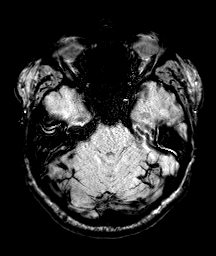
[im 32/48]
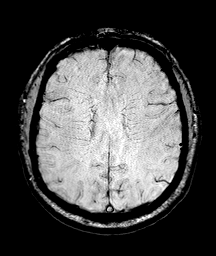
[im 48/48]
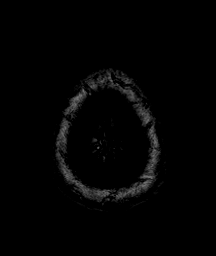

[Series 12: FLAIR · axial · 3.0mm · 0.72mm/px · z∈[-74,+63]mm · 4 of 45 slices shown]
[im 1/45]
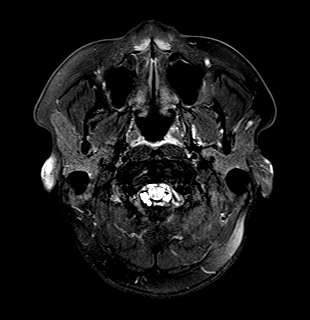
[im 15/45]
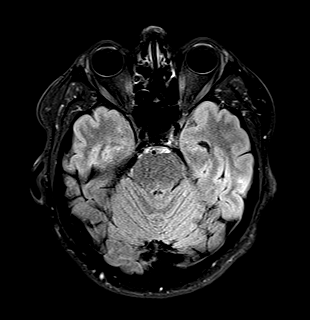
[im 30/45]
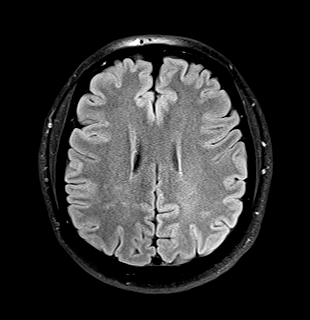
[im 45/45]
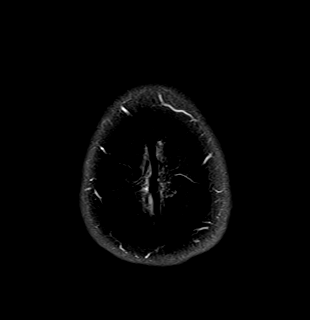

[Series 13: T1 · axial · 2.5mm · 0.50mm/px · 1 of 13 slices shown (3 of 3)]
[im 1/13]
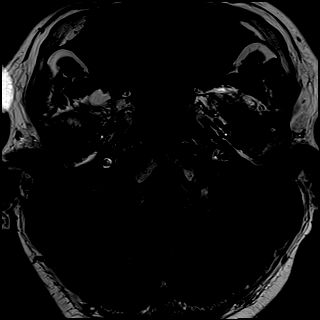

[Series 15: T1 post-contrast · coronal · 2.5mm · 0.56mm/px · 1 of 13 slices shown (1 of 3)]
[im 1/13]
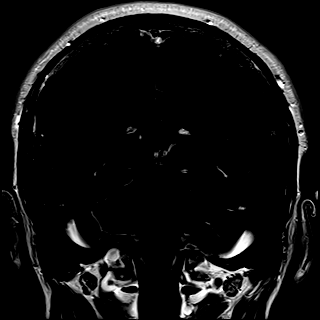

[Series 16: T1 post-contrast · axial · 2.5mm · 0.50mm/px · 1 of 13 slices shown (2 of 3)]
[im 1/13]
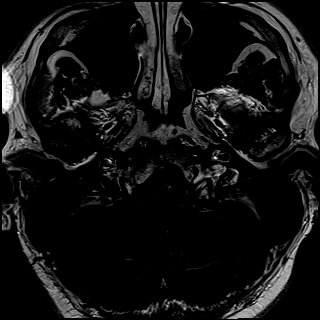

[Series 17: T1 post-contrast · axial · 1.0mm · 0.90mm/px · z∈[-82,+75]mm · 15 of 160 slices shown (3 of 3)]
[im 1/160]
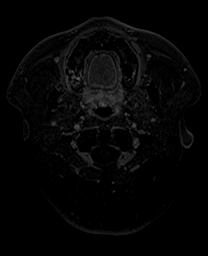
[im 12/160]
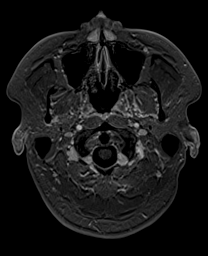
[im 23/160]
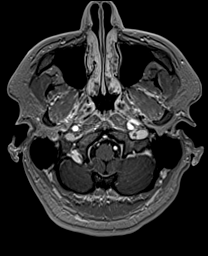
[im 35/160]
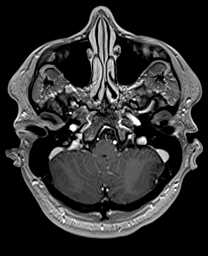
[im 46/160]
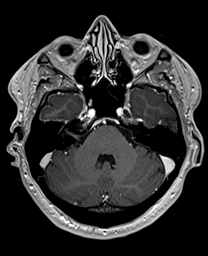
[im 57/160]
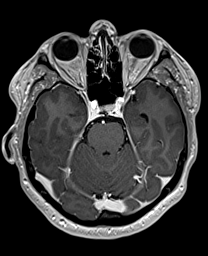
[im 69/160]
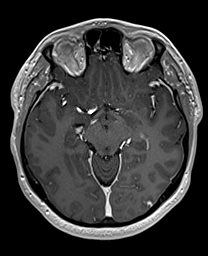
[im 80/160]
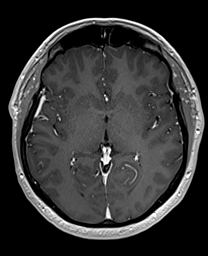
[im 91/160]
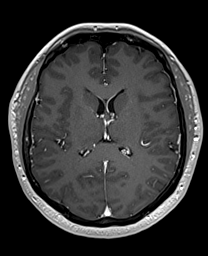
[im 103/160]
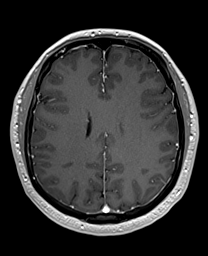
[im 114/160]
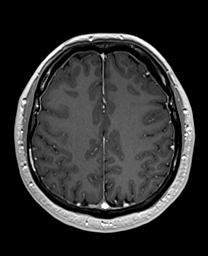
[im 125/160]
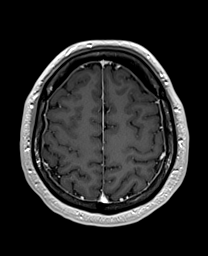
[im 137/160]
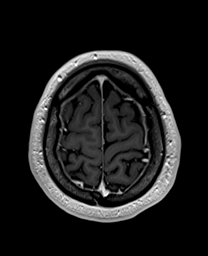
[im 148/160]
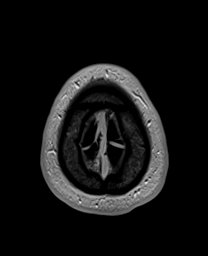
[im 160/160]
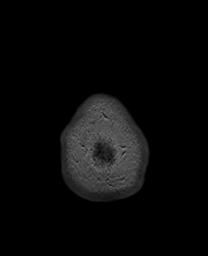

[45 of 48 positions shown; findings below may reference images not displayed]

FINDINGS: Brain: Normal cerebral volume. No restricted diffusion to suggest
acute infarction. No midline shift, mass effect, evidence of mass
lesion, ventriculomegaly, extra-axial collection or acute
intracranial hemorrhage. Cervicomedullary junction and pituitary are
within normal limits.

Small perivascular spaces in the periatrial white matter (normal
variant) with minimal superimposed nonspecific white matter T2 and
FLAIR hyperintensity, including at the left periatrial white matter
(series 12, image 28). No cortical encephalomalacia identified. No
chronic cerebral blood products identified on susceptibility
weighted imaging.

Deep gray matter nuclei, brainstem, and cerebellum appear normal. No
abnormal enhancement identified. There is a small midline
developmental venous anomaly at the pontomedullary junction (normal
variant). No dural thickening.

Vascular: Major intracranial vascular flow voids are preserved, the
left vertebral artery appears dominant.

Skull and upper cervical spine: Negative visible cervical spine.
Visualized bone marrow signal is within normal limits.

Sinuses/Orbits: Normal orbits. Trace paranasal sinus mucosal
thickening. Scalp and face soft tissues appear negative.

Other: Dedicated internal auditory canal imaging.

Tortuosity of the distal left vertebral artery and/or left AICA does
result in mild mass effect at or near the left [DATE] cranial nerve
root entry zone as seen on series 14, image 14 (and series 9, image
9). There is also some left AICA redundancy in the IAC on that side
(image 15). Otherwise normal bilateral cisternal and
intracanalicular 7th and 8th cranial nerve segments. No abnormal
enhancement identified. Symmetric appearing T2 signal in the
bilateral cochlea and vestibular structures. Mastoids are clear.
Normal left stylomastoid foramina.
IMPRESSION: 1. Internal auditory imaging is remarkable for tortuosity of the
distal left vertebral artery and the left AICA.
There is some mass effect at the left cerebellopontine angle at or
near the left 7th/8th cranial nerve root entry zone from the
tortuous vertebral artery, and such an appearance has sometimes been
implicated in cranial neuropathy.

2. No acute intracranial abnormality. Mild for age cerebral white
matter signal changes are nonspecific but can be the sequelae of
trauma. No chronic cerebral blood products are identified.
